# Patient Record
Sex: Male | Born: 1985 | Race: Black or African American | Hispanic: No | Marital: Single | Smoking: Never smoker
Health system: Southern US, Community
[De-identification: ages and names within clinical notes are randomized; demographics above are authoritative.]

---

## 2007-08-23 ENCOUNTER — Emergency Department (HOSPITAL_COMMUNITY): Admission: EM | Admit: 2007-08-23 | Discharge: 2007-08-23 | Payer: Self-pay | Admitting: Emergency Medicine

## 2012-02-25 ENCOUNTER — Emergency Department (INDEPENDENT_AMBULATORY_CARE_PROVIDER_SITE_OTHER)
Admission: EM | Admit: 2012-02-25 | Discharge: 2012-02-25 | Disposition: A | Discharge status: Home / Self Care | Payer: Self-pay | Source: Home / Self Care | Attending: Family Medicine | Admitting: Family Medicine

## 2012-02-25 ENCOUNTER — Encounter (HOSPITAL_COMMUNITY): Payer: Self-pay | Admitting: Emergency Medicine

## 2012-02-25 ENCOUNTER — Emergency Department (INDEPENDENT_AMBULATORY_CARE_PROVIDER_SITE_OTHER): Payer: Self-pay

## 2012-02-25 DIAGNOSIS — IMO0002 Reserved for concepts with insufficient information to code with codable children: Secondary | ICD-10-CM

## 2012-02-25 DIAGNOSIS — S62629A Displaced fracture of medial phalanx of unspecified finger, initial encounter for closed fracture: Secondary | ICD-10-CM

## 2012-02-25 MED ORDER — HYDROCODONE-ACETAMINOPHEN 5-325 MG PO TABS: ORAL_TABLET | ORAL | Status: AC

## 2012-02-25 NOTE — ED Provider Notes (Cosign Needed)
History     CSN: [id number]  Arrival date & time 02/25/12  0903   First MD Initiated Contact with Patient 02/25/12 [phone]      Chief Complaint  Patient presents with  . Finger Injury    (Consider location/radiation/quality/duration/timing/severity/associated sxs/prior treatment) HPI Comments: John Reed presents for evaluation of pain and swelling in the PIP joint of his right middle finger. He reports he was playing basketball last week when the ball struck him in the finger. He tried ice, but the pain and swelling has persisted. He has sensation, but limited range of motion, and pain with flexion.  Patient is a 26 y.o. male presenting with hand pain. The history is provided by the patient.  Hand Pain This is a new problem. The problem occurs constantly. The problem has not changed since onset.The symptoms are aggravated by exertion. The symptoms are relieved by nothing.    Past Medical History  Diagnosis Date  . Asthma     History reviewed. No pertinent past surgical history.  No family history on file.  History  Substance Use Topics  . Smoking status: Never Smoker   . Smokeless tobacco: Not on file  . Alcohol Use: No      Review of Systems  Constitutional: Negative.   HENT: Negative.   Eyes: Negative.   Respiratory: Negative.   Cardiovascular: Negative.   Gastrointestinal: Negative.   Genitourinary: Negative.   Musculoskeletal: Negative.        Finger swelling, pain  Skin: Negative.   Neurological: Negative.     Allergies  Review of patient's allergies indicates no known allergies.  Home Medications   Current Outpatient Rx  Name Route Sig Dispense Refill  . HYDROCODONE-ACETAMINOPHEN 5-325 MG PO TABS  Take one to two tablets every 4 to 6 hours as needed for pain 15 tablet 0    BP 140/62  Pulse 82  Temp(Src) 98.5 F (36.9 C) (Oral)  Resp 14  SpO2 100%  Physical Exam  Nursing note and vitals reviewed. Constitutional: He is oriented to person, place,  and time. He appears well-developed and well-nourished.  HENT:  Head: Normocephalic and atraumatic.  Eyes: EOM are normal.  Neck: Normal range of motion.  Pulmonary/Chest: Effort normal.  Musculoskeletal:       Right hand: He exhibits decreased range of motion, tenderness, bony tenderness and swelling. He exhibits normal capillary refill. Normal strength noted. He exhibits no finger abduction and no thumb/finger opposition.       Hands: Neurological: He is alert and oriented to person, place, and time.  Skin: Skin is warm and dry.  Psychiatric: His behavior is normal.    ED Course  Procedures (including critical care time)  Labs Reviewed - No data to display Dg Finger Middle Right  02/25/2012  *RADIOLOGY REPORT*  Clinical Data: Injured finger playing basketball.  RIGHT MIDDLE FINGER 2+V  Comparison: None  Findings: There is an avulsion fracture off of the volar and radial aspect of the middle phalanx at the PIP joints.  No other fractures are identified.  IMPRESSION: Volar and radial aspect avulsion fracture at the PIP joint.  Original Report Authenticated By: P. Loralie Champagne, M.D.     1. Avulsion fracture of middle phalanx of finger       MDM  Xray reviewed by radiologist and myself; finger splinted and referred to Hand Surgeon, [facility] on-call; hydrocodone PRN pain with over the counter Aleve or ibuprofen        Renaee Munda, MD  02/25/12 1058 

## 2012-02-25 NOTE — Discharge Instructions (Signed)
You have sustained an avulsion fracture of one of the bones in your finger. These usually heal well. We will place you in a finger splint. However, please follow up with the Orthopaedic Provider listed on your discharge papers, [facility]; you will need to see a Hydrographic surveyor, specifically. Wear splint until told otherwise or cleared by the Orthopaedic provider. Take an over the counter pain medication such as Aleve or ibuprofen for baseline pain control, using the prescription pain medication only as needed.

## 2012-02-25 NOTE — ED Notes (Addendum)
HERE WITH RIGHT HAND middle FINGER PAIN AND SWELLING AFTER BASKETBALL INJURY LAST Thursday.PT STATES THE BALL SLAMMED INTO FINGER BUT STATES I TREATED AT HOME WITH ICE/HEAT BUT THE SWELLING HAS INCREASED AND PAIN.DECREASE ROM AND NO RADIATING PAIN REPORTED

## 2014-06-18 ENCOUNTER — Emergency Department (HOSPITAL_COMMUNITY)
Admission: EM | Admit: 2014-06-18 | Discharge: 2014-06-18 | Disposition: A | Discharge status: Home / Self Care | Payer: BC Managed Care – PPO | Source: Home / Self Care | Attending: Family Medicine | Admitting: Family Medicine

## 2014-06-18 ENCOUNTER — Encounter (HOSPITAL_COMMUNITY): Payer: Self-pay | Admitting: Emergency Medicine

## 2014-06-18 DIAGNOSIS — IMO0002 Reserved for concepts with insufficient information to code with codable children: Secondary | ICD-10-CM

## 2014-06-18 DIAGNOSIS — S76312A Strain of muscle, fascia and tendon of the posterior muscle group at thigh level, left thigh, initial encounter: Secondary | ICD-10-CM

## 2014-06-18 NOTE — Discharge Instructions (Signed)
Thank you for coming in today. Please do the three Askling Hamstring Exercises multiple times a day. Extender, Diver, Glider When you can kick your leg up Normally start doing the Nordic Hamstring Curls.  Follow up with Dr. Katrinka BlazingSmith if not better.  Hamstring Strain  Hamstrings are the large muscles in the back of the thighs. A strain or tear injury happens when there is a sudden stretch or pull on these muscles and tendons. Tendons are cord like structures that attach muscle to bone. These injuries are commonly seen in activities such as sprinting due to sudden acceleration.  DIAGNOSIS  Often the diagnosis can be made by examination. HOME CARE INSTRUCTIONS   Apply ice to the sore area for 15-3920minutes, 03-04 times per day. Do this while awake for the first 2 days. Put the ice in a plastic bag, and place a towel between the bag of ice and your skin.  Keep your knee flexed when possible. This means your foot is held off the ground slightly if you are on crutches. When lying down, a pillow under the knee will take strain off the muscles and provide some relief.  If a compression bandage such as an ace wrap was applied, use it until you are seen again. You may remove it for sleeping, showers and baths. If the wrap seems to be too tight and is uncomfortable, wrap it more loosely. If your toes or foot are getting cold or blue, it is too tight.  Walk or move around as the pain allows, or as instructed. Resume full activities as suggested by your caregiver. This is often safest when the strength of the injured leg has nearly returned to normal.  Only take over-the-counter or prescription medicines for pain, discomfort, or fever as directed by your caregiver. SEEK MEDICAL CARE IF:   You have an increase in bruising, swelling or pain.  You notice coldness or blueness of your toes or foot.  Pain relief is not obtained with medications.  You have increasing pain in the area and seem to be getting worse  rather than better.  You notice your thigh getting larger in size (this could indicate bleeding into the muscle). Document Released: 08/14/2001 Document Revised: 02/11/2012 Document Reviewed: 11/21/2008 [facility] Patient Information 2015 [employer], [place]. This information is not intended to replace advice given to you by your health care provider. Make sure you discuss any questions you have with your health care provider.

## 2014-06-18 NOTE — ED Provider Notes (Signed)
John Reed is a 28 y.o. male who presents to Urgent Care today for leg pain. Patient suffered a left hamstring injury one week ago while sprinting. 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 He notes bruising and difficulty with full extension. His symptoms are improving however are persistent. He is here today for evaluation. He denies any knee pain radiating pain weakness or numbness.   Past Medical History  Diagnosis Date  . Asthma    History  Substance Use Topics  . Smoking status: Never Smoker   . Smokeless tobacco: Not on file  . Alcohol Use: No   ROS as above Medications: No current facility-administered medications for this encounter.   No current outpatient prescriptions on file.    Exam:  BP 128/77  Pulse 78  Temp(Src) 99 F (37.2 C) (Oral)  Resp 14  SpO2 100%  Gen: Well NAD Left posterior leg with ecchymosis medially extending to the knee.  No palpable defects. No tenderness at the initial tuberosity. Mildly tender at the distal muscle belly of the medial hamstring.  Decreased H. test left compared to right Strength is intact bilaterally Normal gait  No results found for this or any previous visit (from the past 24 hour(s)). No results found.  Assessment and Plan: 28 y.o. male with hamstring injury. Involving the left leg. No evidence of avulsion from the origin or insertion.  Plan for Askling exercises as well as Nordic hamstring curls. Followup the sports medicine if not improving  Discussed warning signs or symptoms. Please see discharge instructions. Patient expresses understanding.   This note was created using Conservation officer, historic buildingsDragon voice recognition software. Any transcription errors are unintended.    Rodolph BongEvan S Leobardo Granlund, MD 06/18/14 [phone]

## 2014-06-18 NOTE — ED Notes (Signed)
Left hamstring injury.  Incident last Friday 7/10.  Patient reports running and pushed himself very hard, did not stretch prior to race.
